# Patient Record
Sex: Female | Born: 1980 | Race: White | Hispanic: No | Marital: Single | Smoking: Never smoker
Health system: Southern US, Community
[De-identification: ages and names within clinical notes are randomized; demographics above are authoritative.]

## PROBLEM LIST (undated history)

## (undated) DIAGNOSIS — N200 Calculus of kidney: Secondary | ICD-10-CM

## (undated) HISTORY — PX: HERNIA REPAIR: SHX51

---

## 2008-10-04 ENCOUNTER — Observation Stay: Payer: Self-pay

## 2012-02-11 ENCOUNTER — Emergency Department: Payer: Self-pay | Admitting: Emergency Medicine

## 2012-05-30 ENCOUNTER — Emergency Department: Payer: Self-pay | Admitting: Emergency Medicine

## 2012-05-30 LAB — RAPID INFLUENZA A&B ANTIGENS

## 2013-12-11 ENCOUNTER — Ambulatory Visit: Payer: Self-pay | Admitting: Internal Medicine

## 2013-12-11 LAB — URINALYSIS, COMPLETE
Bacteria: NEGATIVE
Bilirubin,UR: NEGATIVE
GLUCOSE, UR: NEGATIVE mg/dL (ref 0–75)
Ketone: NEGATIVE
LEUKOCYTE ESTERASE: NEGATIVE
NITRITE: NEGATIVE
PH: 6 (ref 4.5–8.0)
PROTEIN: NEGATIVE
SPECIFIC GRAVITY: 1.005 (ref 1.003–1.030)

## 2013-12-11 LAB — GC/CHLAMYDIA PROBE AMP

## 2013-12-11 LAB — WET PREP, GENITAL

## 2013-12-12 LAB — URINE CULTURE

## 2014-03-13 ENCOUNTER — Emergency Department (HOSPITAL_COMMUNITY)
Admission: EM | Admit: 2014-03-13 | Discharge: 2014-03-14 | Disposition: A | Discharge status: Home / Self Care | Payer: Medicaid Other | Attending: Emergency Medicine | Admitting: Emergency Medicine

## 2014-03-13 ENCOUNTER — Encounter (HOSPITAL_COMMUNITY): Payer: Self-pay | Admitting: Emergency Medicine

## 2014-03-13 DIAGNOSIS — F419 Anxiety disorder, unspecified: Secondary | ICD-10-CM | POA: Diagnosis not present

## 2014-03-13 DIAGNOSIS — Z3202 Encounter for pregnancy test, result negative: Secondary | ICD-10-CM | POA: Diagnosis not present

## 2014-03-13 DIAGNOSIS — F1510 Other stimulant abuse, uncomplicated: Secondary | ICD-10-CM | POA: Insufficient documentation

## 2014-03-13 DIAGNOSIS — Z87442 Personal history of urinary calculi: Secondary | ICD-10-CM | POA: Diagnosis not present

## 2014-03-13 HISTORY — DX: Calculus of kidney: N20.0

## 2014-03-13 LAB — CBC
HEMATOCRIT: 40.7 % (ref 36.0–46.0)
Hemoglobin: 13.4 g/dL (ref 12.0–15.0)
MCH: 27.5 pg (ref 26.0–34.0)
MCHC: 32.9 g/dL (ref 30.0–36.0)
MCV: 83.6 fL (ref 78.0–100.0)
PLATELETS: 235 10*3/uL (ref 150–400)
RBC: 4.87 MIL/uL (ref 3.87–5.11)
RDW: 13.3 % (ref 11.5–15.5)
WBC: 7.2 10*3/uL (ref 4.0–10.5)

## 2014-03-13 LAB — COMPREHENSIVE METABOLIC PANEL
ALBUMIN: 3.8 g/dL (ref 3.5–5.2)
ALK PHOS: 77 U/L (ref 39–117)
ALT: 15 U/L (ref 0–35)
AST: 18 U/L (ref 0–37)
Anion gap: 12 (ref 5–15)
BUN: 10 mg/dL (ref 6–23)
CO2: 22 mEq/L (ref 19–32)
Calcium: 9.1 mg/dL (ref 8.4–10.5)
Chloride: 105 mEq/L (ref 96–112)
Creatinine, Ser: 0.78 mg/dL (ref 0.50–1.10)
GFR calc Af Amer: >90 mL/min (ref >90)
GFR calc non Af Amer: >90 mL/min (ref >90)
Glucose, Bld: 86 mg/dL (ref 70–99)
POTASSIUM: 3.6 meq/L — AB (ref 3.7–5.3)
Sodium: 139 mEq/L (ref 137–147)
Total Bilirubin: <0.2 mg/dL — ABNORMAL LOW (ref 0.3–1.2)
Total Protein: 7.3 g/dL (ref 6.0–8.3)

## 2014-03-13 LAB — RAPID URINE DRUG SCREEN, HOSP PERFORMED
Amphetamines: POSITIVE — AB
BARBITURATES: NOT DETECTED
Benzodiazepines: NOT DETECTED
COCAINE: NOT DETECTED
Opiates: NOT DETECTED
TETRAHYDROCANNABINOL: NOT DETECTED

## 2014-03-13 LAB — ETHANOL: Alcohol, Ethyl (B): <11 mg/dL (ref 0–11)

## 2014-03-13 LAB — ACETAMINOPHEN LEVEL: Acetaminophen (Tylenol), Serum: <15 ug/mL (ref 10–30)

## 2014-03-13 LAB — POC URINE PREG, ED: Preg Test, Ur: NEGATIVE

## 2014-03-13 LAB — SALICYLATE LEVEL: Salicylate Lvl: <2 mg/dL — ABNORMAL LOW (ref 2.8–20.0)

## 2014-03-13 MED ORDER — NICOTINE 21 MG/24HR TD PT24: 21.0000 mg | MEDICATED_PATCH | Freq: Every day | TRANSDERMAL | Status: DC

## 2014-03-13 MED ORDER — ONDANSETRON HCL 4 MG PO TABS: 4.0000 mg | ORAL_TABLET | Freq: Three times a day (TID) | ORAL | Status: DC | PRN

## 2014-03-13 MED ORDER — LORAZEPAM 1 MG PO TABS: 0.0000 mg | ORAL_TABLET | Freq: Two times a day (BID) | ORAL | Status: DC

## 2014-03-13 MED ORDER — ZOLPIDEM TARTRATE 5 MG PO TABS: 5.0000 mg | ORAL_TABLET | Freq: Every evening | ORAL | Status: DC | PRN

## 2014-03-13 MED ORDER — VITAMIN B-1 100 MG PO TABS: 100.0000 mg | ORAL_TABLET | Freq: Every day | ORAL | Status: DC

## 2014-03-13 MED ORDER — ACETAMINOPHEN 325 MG PO TABS: 650.0000 mg | ORAL_TABLET | ORAL | Status: DC | PRN

## 2014-03-13 MED ORDER — LORAZEPAM 1 MG PO TABS: 0.0000 mg | ORAL_TABLET | Freq: Four times a day (QID) | ORAL | Status: DC

## 2014-03-13 MED ORDER — IBUPROFEN 200 MG PO TABS: 600.0000 mg | ORAL_TABLET | Freq: Three times a day (TID) | ORAL | Status: DC | PRN

## 2014-03-13 MED ORDER — LORAZEPAM 2 MG/ML IJ SOLN: 0.0000 mg | Freq: Two times a day (BID) | INTRAMUSCULAR | Status: DC

## 2014-03-13 MED ORDER — THIAMINE HCL 100 MG/ML IJ SOLN: 100.0000 mg | Freq: Every day | INTRAMUSCULAR | Status: DC

## 2014-03-13 MED ORDER — LORAZEPAM 2 MG/ML IJ SOLN: 0.0000 mg | Freq: Four times a day (QID) | INTRAMUSCULAR | Status: DC

## 2014-03-13 MED ORDER — BUPROPION HCL ER (XL) 300 MG PO TB24
300.0000 mg | ORAL_TABLET | Freq: Every day | ORAL | Status: DC
  Filled 2014-03-13: qty 1

## 2014-03-13 NOTE — ED Provider Notes (Signed)
CSN: [id number]     Arrival date & time 03/13/14  1945 History   First MD Initiated Contact with Patient 03/13/14 2006     This chart was scribed for non-physician practitioner, Arthor CaptainAbigail Jaramiah Bossard, PA-C, working with Linwood DibblesJon Knapp, MD by Arlan OrganAshley Leger, ED Scribe. This patient was seen in room WTR3/WLPT3 and the patient's care was started at 8:21 PM.   Chief Complaint  Patient presents with  . detox    HPI  HPI Comments: Sophia Park is a 33 y.o. female who presents to the Emergency Department here for detox today. Pt has been using Adderal intermittently since the age of 33. Last usage of Adderral approximately 1 hour prior at 40 mg. However, typically she takes about 150 mg of Adderal daily. She admits to taking Xanax as well but denies any abuse of this medication. Last dose of Xanax at 9 AM this morning. She attempted detox about 2 years ago and was [facility]. At that time, she was admitted to psych ED for 5 days but was not sent to a facility for detox. Ms. Louann SjogrenBoss denies any SI or HI. Pt has been accepted to [facility] in [place] but facility is requiring 3 days of detox prior to arrival. AvalaNMP 02/18/2014.   Past Medical History  Diagnosis Date  . Kidney stones    Past Surgical History  Procedure Laterality Date  . Hernia repair     Family History  Problem Relation Age of Onset  . Atrial fibrillation Father    History  Substance Use Topics  . Smoking status: Never Smoker   . Smokeless tobacco: Not on file  . Alcohol Use: No   OB History   Grav Para Term Preterm Abortions TAB SAB Ect Mult Living                 Review of Systems  Constitutional: Negative for fever and chills.  Psychiatric/Behavioral: Negative for suicidal ideas. The patient is nervous/anxious.       Allergies  Erythromycin and Septra ds  Home Medications   Prior to Admission medications   Not on File   Triage Vitals: BP 152/79  Pulse 123  Temp(Src) 98.3 F (36.8 C) (Oral)  Resp 20  Ht  5\' 8"  (1.727 m)  Wt 175 lb (79.379 kg)  BMI 26.61 kg/m2  SpO2 100%  LMP 02/18/2014   Physical Exam  Nursing note and vitals reviewed. Constitutional: She is oriented to person, place, and time. She appears well-developed and well-nourished.  HENT:  Head: Normocephalic.  Eyes: EOM are normal.  Neck: Normal range of motion.  Cardiovascular: Normal rate, regular rhythm and normal heart sounds.   Pulmonary/Chest: Effort normal and breath sounds normal.  Abdominal: She exhibits no distension.  Musculoskeletal: Normal range of motion.  Neurological: She is alert and oriented to person, place, and time.  Psychiatric: She has a normal mood and affect.    ED Course  Procedures (including critical care time)  DIAGNOSTIC STUDIES: Oxygen Saturation is 100% on RA, Normal by my interpretation.    COORDINATION OF CARE: 9:14 PM-Discussed treatment plan with pt at bedside and pt agreed to plan.     Labs Review Labs Reviewed  ACETAMINOPHEN LEVEL  CBC  COMPREHENSIVE METABOLIC PANEL  ETHANOL  SALICYLATE LEVEL  URINE RAPID DRUG SCREEN (HOSP PERFORMED)  POC URINE PREG, ED    Imaging Review No results found.   EKG Interpretation None      MDM   Final  diagnoses:  Amphetamine abuse    Patient here for detox. She is on daily benzodiazepine and has been abusing amphetamines. She has been accepted to [facility]. She took 40 mg of adderal just PTA. I have initiated CIWA and consult with TTS.  I personally performed the services described in this documentation, which was scribed in my presence. The recorded information has been reviewed and is accurate.     2:46 AM BP 145/88  Pulse 110  Temp(Src) 98.5 F (36.9 C) (Oral)  Resp 20  Ht 5\' 8"  (1.727 m)  Wt 175 lb (79.379 kg)  BMI 26.61 kg/m2  SpO2 100%  LMP 02/18/2014 Patient evaluated by TTS. Does not meet detox criteria. Patient will be discharged with her labs from today. Follow up at [facility] on Monday.  Arthor Captain, PA-C 03/14/14 [id number]

## 2014-03-13 NOTE — ED Notes (Signed)
Pt states she needs detox from xanax and adderal   Pt states she has been accepted into [facility] [place] but they require 3 days of detox prior to her arrival so she was sent here  Last used xanax this morning around 9am and adderal was about 1 hr ago

## 2014-03-14 ENCOUNTER — Encounter (HOSPITAL_COMMUNITY): Payer: Self-pay | Admitting: *Deleted

## 2014-03-14 NOTE — ED Provider Notes (Signed)
Medical screening examination/treatment/procedure(s) were performed by non-physician practitioner and as supervising physician I was immediately available for consultation/collaboration.   EKG Interpretation None        [name], DO 03/14/14 0656 

## 2014-03-14 NOTE — Progress Notes (Signed)
Pt told she could would not be admitted, pt tearful and upset. Provider gave her an explaination as to why she could not be admitted. Pt left upset without signing for d/c instructions

## 2014-03-14 NOTE — Discharge Instructions (Signed)
[facility]. You do not meet criteria for inpatient detox. Please [facility] on Monday. Stimulant Use Disorder-Amphetamines  Amphetamines are one of a group of powerful drugs known as stimulants. Amphetamines have a number of medical uses, including the treatment of a daytime sleepiness disorder due to narcolepsy or sleep apnea, attention deficit hyperactivity disorder, and chronic fatigue syndrome. However, amphetamines also are often misused because of the effects they produce. These effects include:  A feeling of extreme pleasure (euphoria).  Alertness.  Increased attention.  High energy.  Loss of appetite for weight loss. Common street names for these drugs include speed and crank. Amphetamines are taken by mouth, crushed and snorted, or dissolved in water and injected. Stimulants are addictive because they activate regions of the brain that are responsible for producing both the pleasurable sensation of "reward" and psychological dependence. Together, these actions account for loss of control and the rapid development of drug dependence. This means you will become ill without the drug (withdrawal) and need to keep using it to function.  Stimulant use disorder is use of stimulants that disrupts your daily life. It disrupts relationships with family and friends and how you do your job. Amphetamines increase blood pressure and heart rate. Use can lead to heart attack or stroke. Use can also cause death from irregular heart rate, seizures, or dangerously high body temperature. SIGNS AND SYMPTOMS  Symptoms of stimulant use disorder with amphetamines include:  Use of amphetamines in larger amounts or over a longer period than intended.  Unsuccessful attempts to cut down or control amphetamine use.  A lot of time spent obtaining, using, or recovering from the effects of amphetamines.  A strong desire or urge to use amphetamines (craving).  Continued use of  amphetamines in spite of major problems at work, school, or home because of use.  Continued use of amphetamines in spite of relationship problems because of use.  Giving up or cutting down on important life activities because of amphetamine use.  Use of amphetamines over and over in situations when it is physically hazardous, such as driving a car.  Continued use of amphetamines in spite of a physical problem that is likely related to amphetamine use. Physical problems can include:  Unintended weight loss.  High blood pressure.  Chest pain.  Infections such as human immunodeficiency virus and hepatitis (from injecting amphetamines).  Continued use of amphetamines in spite of mental problems that are likely related to use. Mental problems can include:  Anxiety.  Sleep problems.  Schizophrenia-like symptoms.  Depression.  Bipolar mood swings.  Violent behavior.  Need to use more and more amphetamines to get the same effect, or lessened effect over time with use of the same amount (tolerance).  Having withdrawal symptoms when amphetamine use is stopped, or using amphetamines to reduce or avoid withdrawal symptoms. Withdrawal symptoms include:  Depressed mood.  Low energy or restlessness.  Bad dreams.  Too little or too much sleep.  Increased appetite. DIAGNOSIS  Stimulant use disorder is diagnosed by your health care provider. You may be asked questions about your amphetamine use and how it affects your life. A physical exam may be done. A drug screen may be ordered. You may be referred to a mental health professional. The diagnosis of stimulant use disorder requires two or more symptoms within 12 months. The type of stimulant use disorder you have depends on the number of signs and symptoms you have. The type may be:  Mild. Two or three signs and symptoms.  Moderate. Four or five signs and symptoms.  Severe. Six or more signs and symptoms. TREATMENT  The treatment  for most problems related to stimulant use disorder with amphetamines may be divided into two types:  Short-term medical treatment. This helps to preserve life and prevent or minimize damage from physical or mental problems related to use.  Long-term substance abuse treatment. This focuses on recovery from use disorder. It is provided by mental health professionals who have training in substance use disorders. It is usually a combination of counseling, support groups, and nonaddictive medicines that can reduce cravings or block the effects of amphetamines. HOME CARE INSTRUCTIONS   Take medicines only as directed by your health care provider.  Identify the people and activities that trigger your amphetamine use and avoid them.  Keep all follow-up visits as directed by your health care provider. SEEK MEDICAL CARE IF:  Your symptoms get worse or you relapse.  You are not able to take medicines as directed. SEEK IMMEDIATE MEDICAL CARE IF:   You have serious thoughts about hurting yourself or others.  You have a seizure, chest pain, sudden weakness, or loss of speech or vision. FOR MORE INFORMATION  National Institute on Drug Abuse: www.drugabuse.gov  Substance Abuse and Mental Healthhttp://www.price-smith.com/ Services Administration: SkateOasis.com.ptwww.samhsa.gov Document Released: 05/17/2001 Document Revised: 10/07/2013 Document Reviewed: 06/05/2013 [facility] Patient Information 2015 [employer] [place]. This information is not intended to replace advice given to you by your health care provider. Make sure you discuss any questions you have with your health care provider.

## 2014-03-14 NOTE — BH Assessment (Signed)
Tele Assessment Note   Sophia Park is a 33 y.o. female who presents to [facility] for detox.  Pt denies SI/HI/AVH.  Pt reports she is abusing xanax and adderall and has been accepted to [facility] for rehab and they require detox before entering program.  Pt states she is prescribed xanax and adderall; stating that uses approx 2mg , daily.  Pt is prescribed .5mg  4x's a day.  Pt says she only uses the xanax when she needs it and "its not a problem".  Pt told medical staff that she uses about 150mg  of adderall a day. Pt.'s last use of medication was 03/13/14 she took 1 pill prior to coming to the [facility].  Pt told this writer that she doesn't use adderall daily, just a few times a week.  Pt says she will be entering rehab on Monday, this writer informed pt that she could detox with outpatient resources.  Pt d/c with referrals.   Axis I: Amphetamine-type substance use disorder Axis II: Deferred Axis III:  Past Medical History  Diagnosis Date  . Kidney stones    Axis IV: other psychosocial or environmental problems, problems related to social environment and problems with primary support group Axis V: 51-60 moderate symptoms  Past Medical History:  Past Medical History  Diagnosis Date  . Kidney stones     Past Surgical History  Procedure Laterality Date  . Hernia repair      Family History:  Family History  Problem Relation Age of Onset  . Atrial fibrillation Father     Social History:  reports that she has never smoked. She does not have any smokeless tobacco history on file. She reports that she uses illicit drugs (Amphetamines and Benzodiazepines). She reports that she does not drink alcohol.  Additional Social History:  Alcohol / Drug Use Pain Medications: See MAR  Prescriptions: See MAR  Over the Counter: See MAR  History of alcohol / drug use?: Yes Longest period of sobriety (when/how long): None  Negative Consequences of Use: Work / School;Personal  relationships Withdrawal Symptoms: Other (Comment) (No current w/d sxs ) Substance #1 Name of Substance 1: Adderall 1 - Age of First Use: Unk  1 - Amount (size/oz): Varies  1 - Frequency: Wkly  1 - Duration: On-going  1 - Last Use / Amount: 03/13/14 Substance #2 Name of Substance 2: Xanax 2 - Age of First Use: Unk  2 - Amount (size/oz): Varies  2 - Frequency: WKly  2 - Duration: On-going  2 - Last Use / Amount: 2 Days Ago   CIWA: CIWA-Ar BP: 145/88 mmHg Pulse Rate: 110 COWS:    PATIENT STRENGTHS: (choose at least two) Motivation for treatment/growth  Allergies:  Allergies  Allergen Reactions  . Septra Ds [Sulfamethoxazole-Tmp Ds] Hives  . Erythromycin Rash    Home Medications:  (Not in a hospital admission)  OB/GYN Status:  Patient's last menstrual period was 02/18/2014.  General Assessment Data Location of Assessment: [facility] Is this a Tele or Face-to-Face Assessment?: Face-to-Face Is this an Initial Assessment or a Re-assessment for this encounter?: Initial Assessment Living Arrangements: Spouse/significant other;Children Can pt return to current living arrangement?: Yes Admission Status: Voluntary Is patient capable of signing voluntary admission?: Yes Transfer from: Acute Hospital Referral Source: MD  Medical Screening Exam [facility] Walk-in ONLY) Medical Exam completed: No Reason for MSE not completed: Other: (None )  [facility] Crisis Care Plan Living Arrangements: Spouse/significant other;Children Name of Psychiatrist: None  Name of Therapist: None  Education Status Is patient currently in school?: No Current Grade: None  Highest grade of school patient has completed: None  Name of school: None  Contact person: None   Risk to self with the past 6 months Suicidal Ideation: No Suicidal Intent: No Is patient at risk for suicide?: No Suicidal Plan?: No Access to Means: No What has been your use of drugs/alcohol within the last 12 months?: Abusing: xanax and  adderall  Previous Attempts/Gestures: No How many times?: 0 Other Self Harm Risks: None  Triggers for Past Attempts: None known Intentional Self Injurious Behavior: None Family Suicide History: No Recent stressful life event(s): Other (Comment) (SA) Persecutory voices/beliefs?: No Depression: Yes Depression Symptoms: Loss of interest in usual pleasures Substance abuse history and/or treatment for substance abuse?: Yes Suicide prevention information given to non-admitted patients: Not applicable  Risk to Others within the past 6 months Homicidal Ideation: No Thoughts of Harm to Others: No Current Homicidal Intent: No Current Homicidal Plan: No Access to Homicidal Means: No Identified Victim: None  History of harm to others?: No Assessment of Violence: None Noted Violent Behavior Description: None  Does patient have access to weapons?: No Criminal Charges Pending?: No Does patient have a court date: No  Psychosis Hallucinations: None noted Delusions: None noted  Mental Status Report Appear/Hygiene: Other (Comment) (Appropriate ) Eye Contact: Good Motor Activity: Unremarkable Speech: Logical/coherent;Pressured;Rapid Level of Consciousness: Alert Mood: Anxious Affect: Anxious Anxiety Level: Minimal Thought Processes: Coherent;Relevant Judgement: Unimpaired Orientation: Person;Place;Time;Situation Obsessive Compulsive Thoughts/Behaviors: None  Cognitive Functioning Concentration: Normal Memory: Recent Intact;Remote Intact IQ: Average Insight: Good Impulse Control: Good Appetite: Good Weight Loss: 0 Weight Gain: 0 Sleep: No Change Total Hours of Sleep: 5 Vegetative Symptoms: None  ADLScreening [facility] Assessment Services) Patient's cognitive ability adequate to safely complete daily activities?: Yes Patient able to express need for assistance with ADLs?: Yes Independently performs ADLs?: Yes (appropriate for developmental age)  Prior Inpatient Therapy Prior  Inpatient Therapy: No Prior Therapy Dates: None  Prior Therapy Facilty/Provider(s): None  Reason for Treatment: None   Prior Outpatient Therapy Prior Outpatient Therapy: No Prior Therapy Dates: None  Prior Therapy Facilty/Provider(s): None  Reason for Treatment: None   ADL Screening (condition at time of admission) Patient's cognitive ability adequate to safely complete daily activities?: Yes Is the patient deaf or have difficulty hearing?: No Does the patient have difficulty seeing, even when wearing glasses/contacts?: No Does the patient have difficulty concentrating, remembering, or making decisions?: No Patient able to express need for assistance with ADLs?: Yes Does the patient have difficulty dressing or bathing?: No Independently performs ADLs?: Yes (appropriate for developmental age) Does the patient have difficulty walking or climbing stairs?: No Weakness of Legs: None Weakness of Arms/Hands: None  Home Assistive Devices/Equipment Home Assistive Devices/Equipment: None  Therapy Consults (therapy consults require a physician order) PT Evaluation Needed: No OT Evalulation Needed: No SLP Evaluation Needed: No Abuse/Neglect Assessment (Assessment to be complete while patient is alone) Physical Abuse: Denies Verbal Abuse: Denies Sexual Abuse: Denies Exploitation of patient/patient's resources: Denies Self-Neglect: Denies Values / Beliefs Cultural Requests During Hospitalization: None Spiritual Requests During Hospitalization: None Consults Spiritual Care Consult Needed: No Social Work Consult Needed: No Merchant navy officerAdvance Directives (For Healthcare) Does patient have an advance directive?: No Would patient like information on creating an advanced directive?: No - patient declined information Nutrition Screen- [facility] Adult/[facility] Patient's home diet: Regular  Additional Information 1:1 In Past 12 Months?: No CIRT Risk: No Elopement Risk: No Does patient have medical clearance?:  Yes     Disposition:  Disposition Initial Assessment Completed for this Encounter: Yes Disposition of Patient: Referred to;Outpatient treatment (D/C with referrals ) Type of outpatient treatment: Adult Patient referred to: Outpatient clinic referral (D/C with SA referrals)  Murrell ReddenSimmons, Hally Colella C 03/14/2014 6:58 AM

## 2014-03-14 NOTE — Progress Notes (Signed)
ACT team at bedside to assess patient

## 2015-02-19 IMAGING — US TRANSABDOMINAL ULTRASOUND OF PELVIS
1 series · 14 of 25 positions shown · non-contrast
Comparison: None.

CLINICAL DATA: Left adnexal pain, swelling

EXAM:
TRANSABDOMINAL ULTRASOUND OF PELVIS
TECHNIQUE: Transabdominal ultrasound examination of the pelvis was performed
including evaluation of the uterus, ovaries, adnexal regions, and
pelvic cul-de-sac.

[Series 1: transabdominal ultrasound of pelvis · 0.18mm/px · 111 acquisitions, 14 frames shown]
[im 1/111]
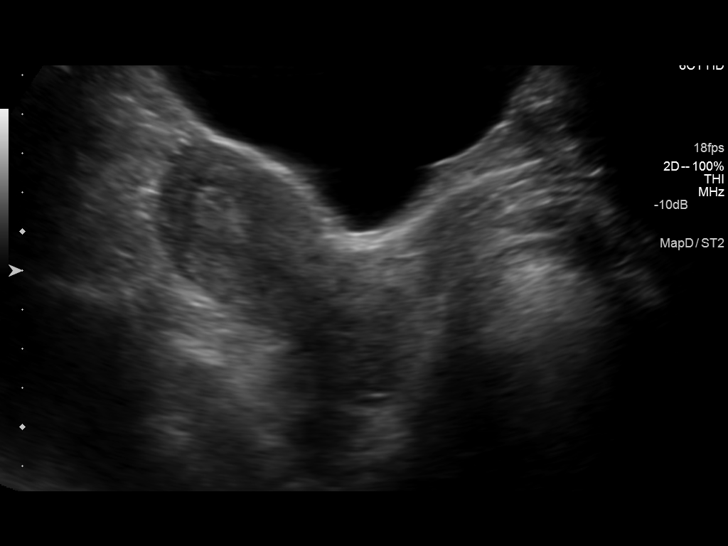
[im 10/111]
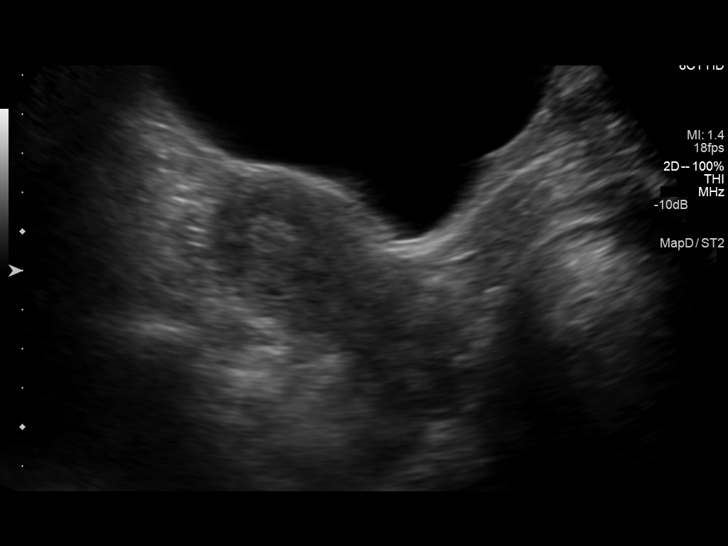
[im 19/111]
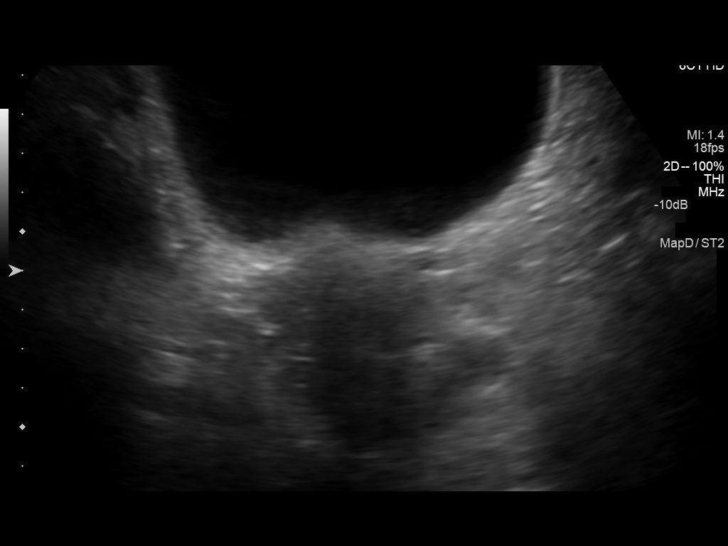
[im 28/111]
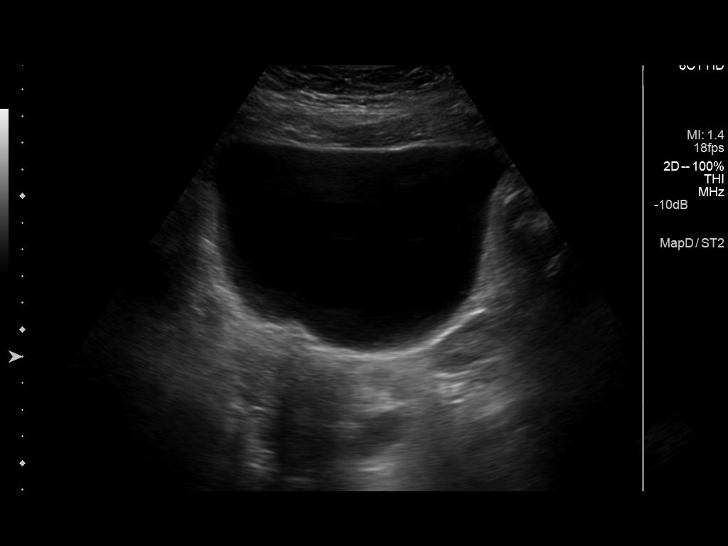
[im 37/111]
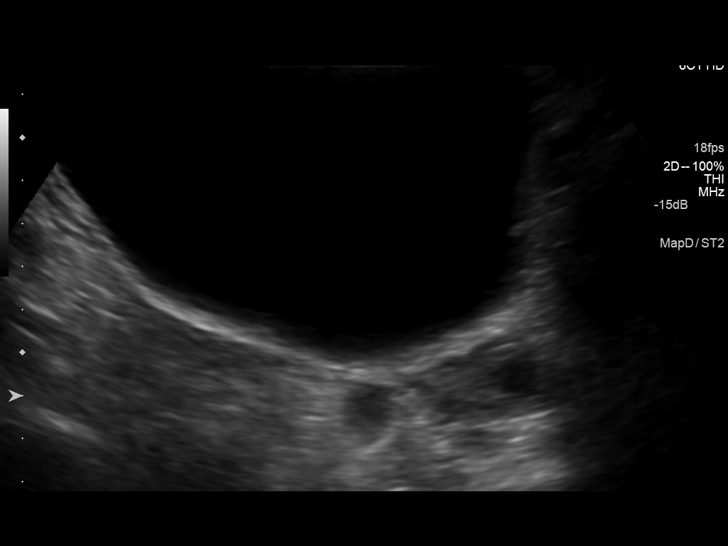
[im 42/111]
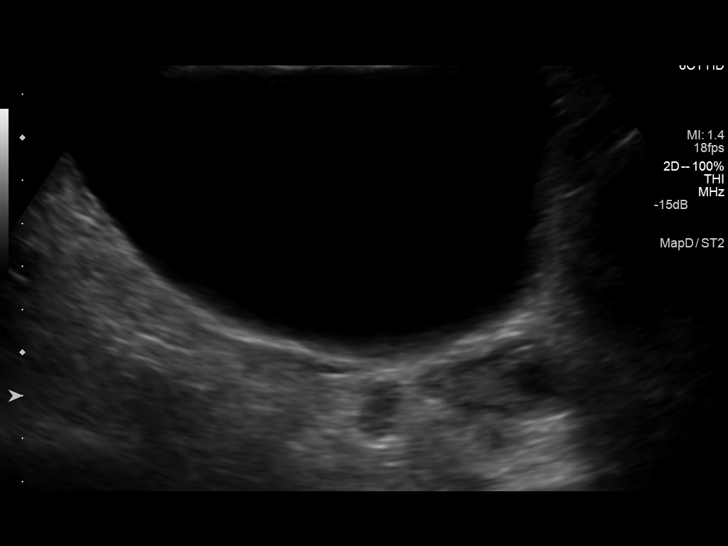
[im 51/111]
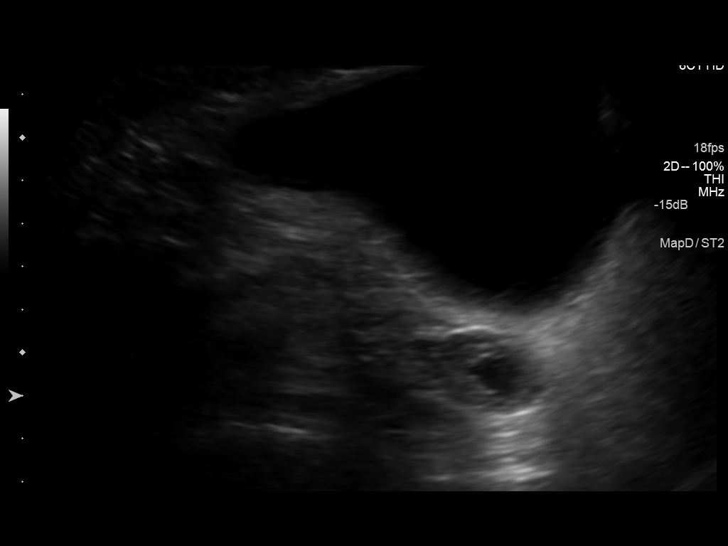
[im 60/111]
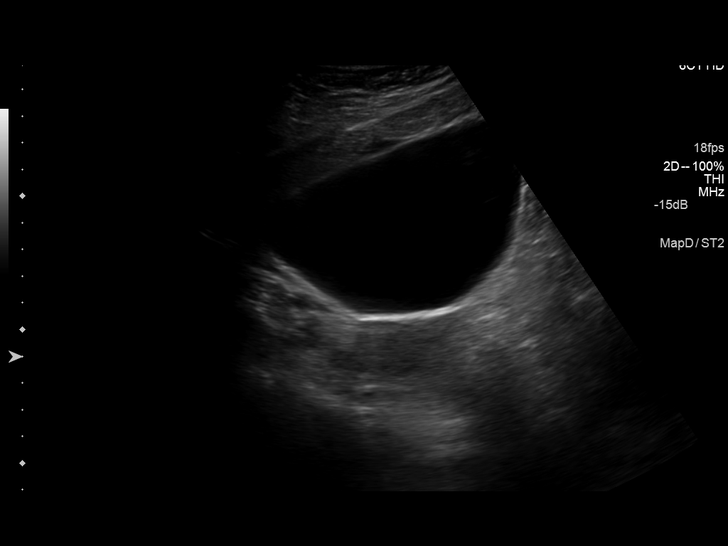
[im 69/111]
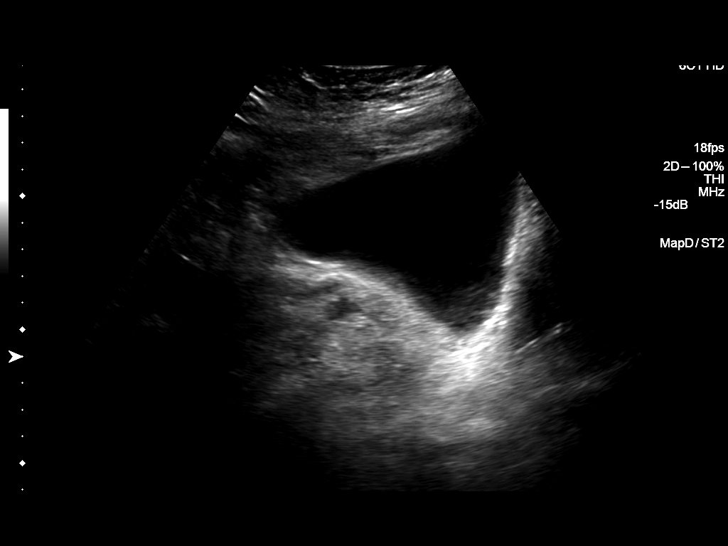
[im 74/111]
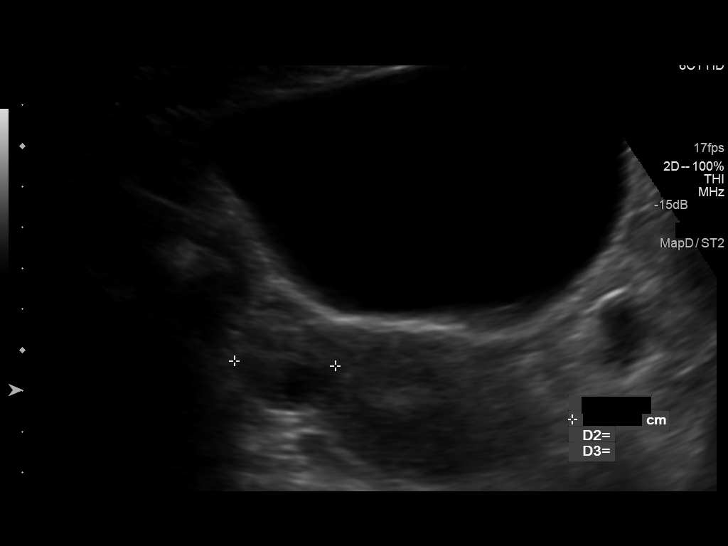
[im 83/111]
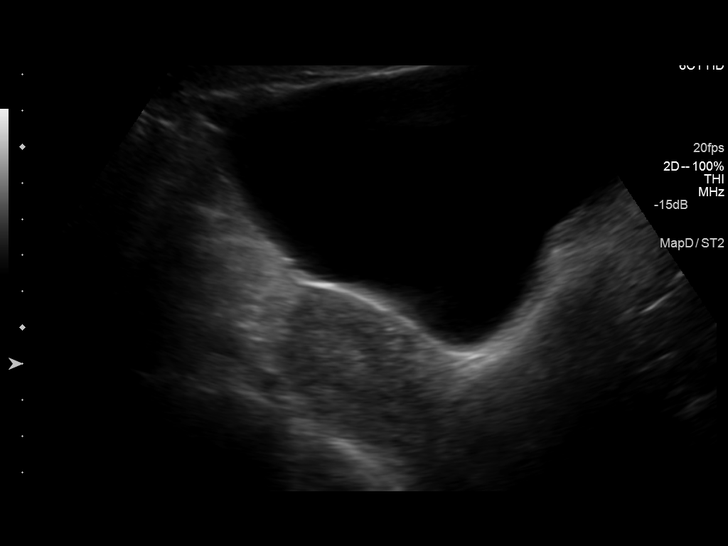
[im 92/111]
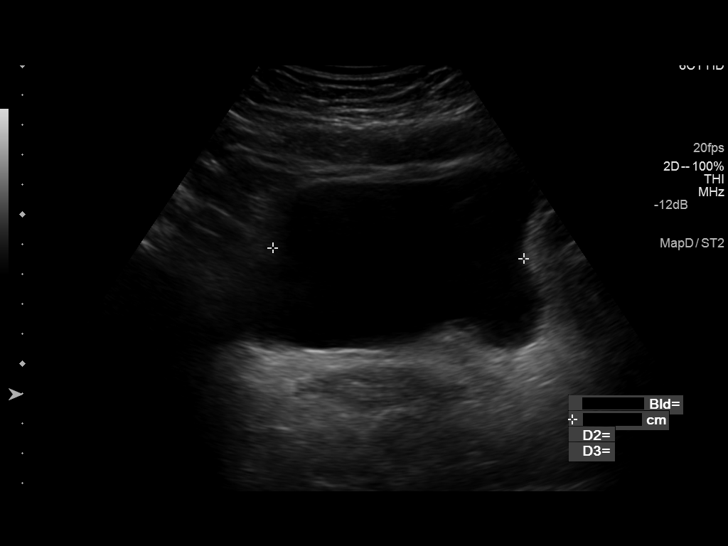
[im 101/111]
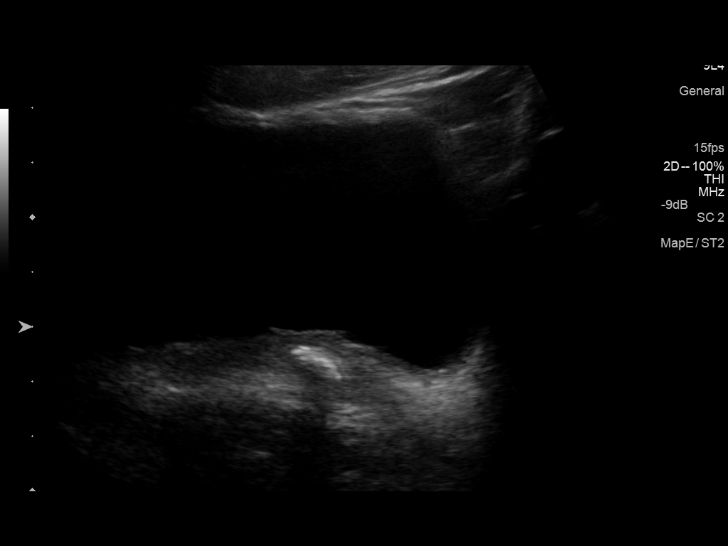
[im 111/111]
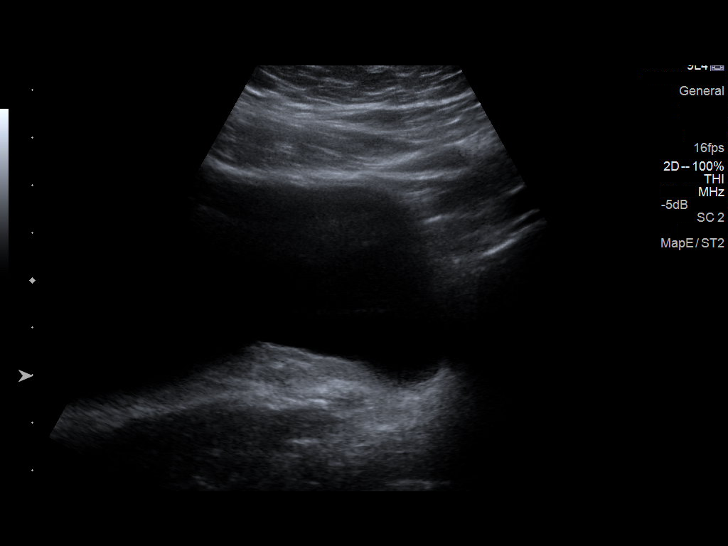

[14 of 25 positions shown; findings below may reference images not displayed]

FINDINGS: Uterus

Measurements: The uterus measures 8.4 x 3.6 x 4.6 cm.. No myometrial
abnormality is seen.

Endometrium

Thickness: The endometrium measures 8.2 mm in thickness.. No focal
abnormality visualized.

Right ovary

Measurements: The right ovary measures 2.9 x 2.0 x 2.5 cm.. No
abnormality is noted.

Left ovary

Measurements: The left ovary measures 4.2 x 2.0 x 2.5 cm.. No
abnormality is noted.

Other findings: There is left hydronephrosis present. There is an
echogenicity within the distal left ureter consistent with a distal
left ureteral calculus of 1.0 x 0.4 x 0.7 cm near the left UV
junction.
IMPRESSION: 1. The uterus and ovaries are unremarkable.
2. Left hydronephrosis with probable distal left ureteral calculus
near the left UV junction as noted above.
